# Patient Record
Sex: Female | Born: 2005 | Hispanic: Yes | Marital: Single | Smoking: Current every day smoker
Health system: Southern US, Community
[De-identification: ages and names within clinical notes are randomized; demographics above are authoritative.]

## PROBLEM LIST (undated history)

## (undated) DIAGNOSIS — S82202A Unspecified fracture of shaft of left tibia, initial encounter for closed fracture: Secondary | ICD-10-CM

## (undated) DIAGNOSIS — D229 Melanocytic nevi, unspecified: Secondary | ICD-10-CM

## (undated) DIAGNOSIS — IMO0002 Reserved for concepts with insufficient information to code with codable children: Secondary | ICD-10-CM

## (undated) DIAGNOSIS — R7871 Abnormal lead level in blood: Secondary | ICD-10-CM

## (undated) DIAGNOSIS — J45909 Unspecified asthma, uncomplicated: Secondary | ICD-10-CM

## (undated) DIAGNOSIS — E785 Hyperlipidemia, unspecified: Secondary | ICD-10-CM

## (undated) DIAGNOSIS — S82409A Unspecified fracture of shaft of unspecified fibula, initial encounter for closed fracture: Secondary | ICD-10-CM

## (undated) DIAGNOSIS — E663 Overweight: Secondary | ICD-10-CM

## (undated) HISTORY — DX: Hyperlipidemia, unspecified: E78.5

## (undated) HISTORY — DX: Unspecified asthma, uncomplicated: J45.909

## (undated) HISTORY — DX: Unspecified fracture of shaft of unspecified fibula, initial encounter for closed fracture: S82.409A

## (undated) HISTORY — DX: Overweight: E66.3

## (undated) HISTORY — DX: Abnormal lead level in blood: R78.71

## (undated) HISTORY — DX: Unspecified fracture of shaft of left tibia, initial encounter for closed fracture: S82.202A

## (undated) HISTORY — DX: Reserved for concepts with insufficient information to code with codable children: IMO0002

## (undated) HISTORY — DX: Melanocytic nevi, unspecified: D22.9

## (undated) HISTORY — PX: COSMETIC SURGERY: SHX468

---
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
## 2023-02-16 ENCOUNTER — Ambulatory Visit (HOSPITAL_COMMUNITY)
Admission: EM | Admit: 2023-02-16 | Discharge: 2023-02-16 | Disposition: A | Discharge status: Home / Self Care | Payer: Medicaid Other | Attending: Psychiatry | Admitting: Psychiatry

## 2023-02-16 DIAGNOSIS — R4589 Other symptoms and signs involving emotional state: Secondary | ICD-10-CM | POA: Insufficient documentation

## 2023-02-16 DIAGNOSIS — F4323 Adjustment disorder with mixed anxiety and depressed mood: Secondary | ICD-10-CM | POA: Insufficient documentation

## 2023-02-16 DIAGNOSIS — R443 Hallucinations, unspecified: Secondary | ICD-10-CM | POA: Insufficient documentation

## 2023-02-16 NOTE — ED Notes (Signed)
Pt discharged with  AVS.  AVS reviewed prior to discharge.  Pt alert, oriented, and ambulatory.  Safety maintained.  °

## 2023-02-16 NOTE — Progress Notes (Signed)
   02/16/23 1406  [facility] Triage Screening (Walk-ins at [facility] only)  How Did You Hear About [place]? Self  What Is the Reason for Your Visit/Call Today? Patient is a 17 year old female who presents voluntarily to [facility], due to patient overthinking a lot to the point that she says it is beginning to affect her in her everyday life. patient admits to having passive SI without intent or plan and says that she thinks about how it would impact others in her life. Patient denies HI or AVH. Patient denies having a mental health diagnosis. She acknowledges she smokes about a gram of marijuana daily along with vaping. Her boyfriend just broke up with her and she also reports having a lot of conflict with her parents, her dad especially. Patient was alert & oriented.  She was tearful but cooperative and answered questions appropriately.  How Long Has This Been Causing You Problems? <Week  Have You Recently Had Any Thoughts About Hurting Yourself? Yes  How long ago did you have thoughts about hurting yourself? today  Are You Planning to Commit Suicide/Harm Yourself At This time? No  Are You Planning To Harm Someone At This Time? No  Are you currently experiencing any auditory, visual or other hallucinations? No  Have You Used Any Alcohol or Drugs in the Past 24 Hours? Yes  How long ago did you use Drugs or Alcohol? yesterday  What Did You Use and How Much? marijuna 1 1/2 gram  Do you have any current medical co-morbidities that require immediate attention? No  Clinician description of patient physical appearance/behavior: p. is causually dressed, aler & oriented x4 tearful but copperative and answered questions appropriately  What Do You Feel Would Help You the Most Today? Treatment for Depression or other mood problem  If access to [facility] was not available, would you have sought care in the Emergency Department? No  Determination of Need Routine (7 days)  Options For Referral Outpatient Therapy

## 2023-02-16 NOTE — Discharge Instructions (Addendum)
Based on what you have shared, a list of resources for outpatient therapy and psychiatry is provided below to get you started back on treatment.  It is imperative that you follow through with treatment within 5-7 days from the day of discharge to prevent any further risk to your safety or mental well-being.  You are not limited to the list provided.  In case of an urgent crisis, you may contact the Mobile Crisis Unit with [facility] at [phone].        Outpatient Services for Therapy and Medication Management for [facility] [address] [place], [place], [address] [phone] phone  New Patient Assessment/Therapy [facility] Monday Wednesday Thursday: 8am until slots are full.- arrive by 7:30 on the second floor.  New Patient Psychiatry/Medication Management Walk-ins Monday-Friday: 8am-11am  For all walk-ins, we ask that you arrive by 7:30am because patient will be seen in the order of arrival.  Availability is limited; therefore, you may not be seen on the same day that you walk-in.  Our goal is to serve and meet the needs of our community to the best of our ability.   [facility] [address] [place], [place], [address] [phone] phone  [facility] [address] [place], [place], [address] [phone] phone [phone] phone ([address], Anthem/Elevance, [address], [address], [address], [address], Healthy [place], [place], [place], [address], [employer], [place], UHC, [employer], [place], Out of Network)  [facility], [place] [address] [place], [place], [address] [phone] phone ([place], Anthem/Elevance, [employer] Options/Carelon, BCBS, [address], [place], [place], [place], [place], [employer], [place], [place], [place], [facility])  [facility] [address] [place], [place],  [address] [phone] phone (Medicaid, ask about other insurance)  [facility] [address] [place], [place], [address] [phone] phone [phone] fax ([address], [place] , [place], [place], [place] Health Choice, UHC, General Electric, Self-Pay)  [facility] [address] [place], [place], [address] [phone] phone ([address], Anthem/Elevance, [address], [address], [place], [employer], [place], [place], [place], [employer], [place], [place], [place], [facility])  [facility] - 6-8 MONTH WAIT FOR THERAPY; [facility]NAG[facility] [address] [place], [place], [address] [phone] phone ([address] AmeriHealth [address] - [place], [address], [place], [place], Friday Health Plans, [address], BCBS Healthy [place], [place], [address], [place], [place], [place], [place], Tricare, UHC, [employer] Corporation, [place])  [facility] [address] [place], [place], [address] [phone] phone  [facility] [address] [place], [place], [address] [phone] phone  [facility] [address] [place], [place], [address] [phone] phone  [facility] [place] - THERAPY ONLY [address], [place], [address] [phone] phone  [facility], [place] [address], [place], [address] [phone] phone  [facility] [address] [place], [place], [address] [phone] phone [phone] fax  [facility] [address] [place], [place], [address] [phone] phone [phone] fax  [facility] [phone] [address] [place], [place], [address] [phone] phone  Jovita Kussmaul [address] [place], [place], [address]  [phone] phone  [facility]  (Adults Only) [address] [place], [place], [address]  [phone] phone [phone] fax

## 2023-02-16 NOTE — ED Provider Notes (Signed)
Behavioral Health Urgent Care Medical Screening Exam  Patient Name: Tabitha [name] MRN: [id number] Date of Evaluation: 02/16/23 Chief Complaint:  increased depression and anxiety due to current relationship with boyfriend.  Diagnosis:  Final diagnoses:  Adjustment disorder with mixed anxiety and depressed mood    History of Present illness: Tabitha [name] is a 17 y.o. female patient presented to [facility] as a walk in unaccompanied by with complaints of increased depression and anxiety due to current relationship with boyfriend.   Tabitha [name], 44 y.o., female patient seen face to face by this provider and chart reviewed on 02/16/23.  Patient denies any previous psychiatric history.  She has no psychiatric services in place.  She currently does not take any medications.  She denies any medical concerns.  She admits to smoking marijuana daily.  She does believe that marijuana makes her anxiety worse at times.  On evaluation Tabitha [name] reports she has been in a 2-year long relationship with her boyfriend.  A few months ago she was having some disagreements with her mother and father and she moved out went to live with her boyfriend.  She believes she has been overthinking different situations that have gone on in the relationship.  States he does what ever he wants to but holds her to different standards.  He broke up with her about 2 weeks ago.  She  is now living with her parents again.  States it has been very distressing because her boyfriend has blocked her on all social media and is "ghosting" her.  This is caused an increase in her depression and anxiety.  She feels helpless, decreased motivation, decreased focus, and feelings of loneliness.  She have a depressed affect and is tearful at times throughout the assessment.  She reports difficulty staying asleep at night.  She denies any concerns with appetite or sleep.  She adamantly denies suicidal  ideations.  She verbally contracts for safety.  She does not have access to firearms/weapons.  She denies any homicidal ideations.  She denies AVH.  She does not appear psychotic, paranoid or manic. Objectively, there is no evidence of psychosis/mania or delusional thinking.  She conversed coherently, with goal directed thoughts, and no distractibility, or pre-occupation.  Educated patient on the adverse effects of marijuana use.  She verbalized understanding.  Discussed partial hospitalization program (PHP) with [facility].  Patient reports she did start a new job at [employer] and she is unsure if she would be able to work around her schedule.  She is interested in individual therapy as well.  Provided resources for medication management and therapy.  Provided open access walk-in hours to [facility] on the second floor.  At this time Tabitha [name] is educated and verbalizes understanding of mental health resources and other crisis services in the community. She is instructed to call 911 and present to the nearest emergency room should sh experience any suicidal/homicidal ideation, auditory/visual/hallucinations, or detrimental worsening of her mental health condition.  She was a also advised by Clinical research associate that She could call the toll-free phone on back of  insurance card to assist with identifying counselors and agencies in network.  Medicaid card to speak with care coordinator.    Flowsheet Row ED from 02/16/2023 in [facility] ED to Hosp-Admission (Discharged) from 02/19/2022 in [place] [facility]TH  SURGICAL ED from 04/10/2021 in [facility] Emergency Department at [facility]  C-SSRS RISK CATEGORY Low Risk  No Risk No Risk       Psychiatric Specialty Exam  Presentation  General Appearance:Appropriate for Environment  Eye Contact:Good  Speech:Clear and Coherent; Normal Rate  Speech Volume:Normal  Handedness:Right   Mood and Affect   Mood:Anxious; Depressed  Affect:Congruent   Thought Process  Thought Processes:Coherent  Descriptions of Associations:Intact  Orientation:Full (Time, Place and Person)  Thought Content:Logical    Hallucinations:None  Ideas of Reference:None  Suicidal Thoughts:No  Homicidal Thoughts:No   Sensorium  Memory:Immediate Good; Recent Good; Remote Good  Judgment:Good  Insight:Good   Executive Functions  Concentration:Good  Attention Span:Good  Recall:Good  Fund of Knowledge:Good  Language:Good   Psychomotor Activity  Psychomotor Activity:Normal   Assets  Assets:Communication Skills; Desire for Improvement; Financial Resources/Insurance; Housing; Physical Health; Resilience; Social Support   Sleep  Sleep:Fair  Number of hours: No data recorded  Physical Exam: Physical Exam Vitals and nursing note reviewed.  Constitutional:      General: She is not in acute distress.    Appearance: Normal appearance. She is not ill-appearing.  HENT:     Head: Normocephalic.  Eyes:     General:        Right eye: No discharge.        Left eye: No discharge.  Cardiovascular:     Rate and Rhythm: Normal rate.  Pulmonary:     Effort: Pulmonary effort is normal.  Musculoskeletal:        General: Normal range of motion.     Cervical back: Normal range of motion.  Skin:    Coloration: Skin is not jaundiced or pale.  Neurological:     Mental Status: She is alert and oriented to person, place, and time.  Psychiatric:        Attention and Perception: Attention and perception normal.        Mood and Affect: Mood is anxious and depressed. Affect is tearful.        Speech: Speech normal.        Behavior: Behavior normal. Behavior is cooperative.        Thought Content: Thought content normal.        Cognition and Memory: Cognition normal.        Judgment: Judgment normal.    Review of Systems  Constitutional: Negative.   HENT: Negative.    Eyes: Negative.    Respiratory: Negative.    Cardiovascular: Negative.   Musculoskeletal: Negative.   Skin: Negative.   Neurological: Negative.   Psychiatric/Behavioral:  Positive for depression. The patient is nervous/anxious.    Blood pressure 136/82, pulse 79, temperature 98.5 F (36.9 C), temperature source Oral, resp. rate 18, SpO2 98%. There is no height or weight on file to calculate BMI.  Musculoskeletal: Strength & Muscle Tone: within normal limits Gait & Station: normal Patient leans: N/A   [facility] MSE Discharge Disposition for Follow up and Recommendations: Based on my evaluation the patient does not appear to have an emergency medical condition and can be discharged with resources and follow up care in outpatient services for Medication Management, Partial Hospitalization Program, and Individual Therapy  Discharge patient  Provided outpatient psychiatric resources for medication management and therapy.  Discussed referral to [facility] program with [facility]    Ardis Hughs, NP 02/16/2023, 4:16 PM

## 2023-02-17 ENCOUNTER — Other Ambulatory Visit: Payer: Self-pay

## 2023-02-17 ENCOUNTER — Encounter: Payer: Self-pay | Admitting: Obstetrics and Gynecology

## 2023-02-17 ENCOUNTER — Ambulatory Visit (INDEPENDENT_AMBULATORY_CARE_PROVIDER_SITE_OTHER): Payer: Medicaid Other | Admitting: Obstetrics and Gynecology

## 2023-02-17 VITALS — BP 118/70 | HR 81 | Ht 63.0 in | Wt 146.0 lb

## 2023-02-17 DIAGNOSIS — N914 Secondary oligomenorrhea: Secondary | ICD-10-CM

## 2023-02-17 DIAGNOSIS — E282 Polycystic ovarian syndrome: Secondary | ICD-10-CM

## 2023-02-17 MED ORDER — MEDROXYPROGESTERONE ACETATE 10 MG PO TABS: 10.0000 mg | ORAL_TABLET | Freq: Every day | ORAL | 3 refills | Status: AC

## 2023-02-17 MED ORDER — MEDROXYPROGESTERONE ACETATE 10 MG PO TABS: 10.0000 mg | ORAL_TABLET | Freq: Every day | ORAL | 3 refills | Status: DC

## 2023-02-17 NOTE — Progress Notes (Signed)
GYNECOLOGY OFFICE VISIT NOTE  History:   Tabitha [name] is a 17 y.o. G0P0000 here today for discussion regarding PCOS.   Diagnosed with PCOS by pediatrician many years ago. But she questions this diagnosis. Diagnosis was made by history. Menarche was 12-13 years. Diagnosis was made shortly after menarche.   Sometimes monthly period, sometimes every 3-6 months. Sometimes they are heavy.  LMP 01/30/23 - was 5 day typical period.   GAD7/PHQ9 elevated - [facility]. Planning to go on Thursday for walk in therapist.   Micah Flesher on birth control when menses started and feels like mood isn't the same since.   Uses withdrawal method for birth control.  Sometimes restrictive eating.     Past Medical History:  Diagnosis Date   Asthma    Phreesia 01/08/2020   Behavior problems 3/12   Elevated blood lead level 2/07 - 8/07   Fracture closed, fibula, shaft 1/08   fell from trampoline   Fracture of tibia, left, closed 1/08   fell from trampoline   Hyperlipidemia 3/12   elevated cholesterol, triglyceride and VLDL   Nevus congenital giant   One very large, covering left shoulder, upper chest;  excised 9/05.  Multiple other smaller nevi.    Overweight 2/11    Past Surgical History:  Procedure Laterality Date   CHOLECYSTECTOMY N/A 02/20/2022   Procedure: LAPAROSCOPIC CHOLECYSTECTOMY;  Surgeon: Kinsinger, De Blanch, MD;  Location: [facility];  Service: General;  Laterality: N/A;   COSMETIC SURGERY      The following portions of the patient's history were reviewed and updated as appropriate: allergies, current medications, past family history, past medical history, past social history, past surgical history and problem list.   Review of Systems:  Pertinent items noted in HPI and remainder of comprehensive ROS otherwise negative.  Physical Exam:  BP 118/70   Pulse 81   Ht 5\' 3"  (1.6 m)   Wt 146 lb (66.2 kg)   LMP 01/30/2023 (Within Days)   BMI 25.86  kg/m  CONSTITUTIONAL: Well-developed, well-nourished female in no acute distress.  HEENT:  Normocephalic, atraumatic. External right and left ear normal. No scleral icterus.  NECK: Normal range of motion, supple, no masses noted on observation SKIN: No rash noted. Not diaphoretic. No erythema. No pallor. MUSCULOSKELETAL: Normal range of motion. No edema noted. NEUROLOGIC: Alert and oriented to person, place, and time. Normal muscle tone coordination. No cranial nerve deficit noted. PSYCHIATRIC: Normal mood and affect. Normal behavior. Normal judgment and thought content. PELVIC: Deferred  Labs and Imaging No results found for this or any previous visit (from the past 168 hour(s)). No results found.  Assessment and Plan:   1. Secondary oligomenorrhea Will reconfirm PCOS diagnosis per pt request.   2. PCOS (polycystic ovarian syndrome) - Discussed diagnosis of PCOS and on what basis. Reviewed long term implication of issues with weight loss, metabolic syndrome, infertility - Labwork for FLP and HgBA1C: Due - new Rx given - Reviewed possible help of Myoinositiol/D-chiroinositol with weight loss. Offered referral to dietician. She  Declines - she did it in past .  - Discussed importance of endometrial protection with different hormonal methods to prevent hyperplasia/malignancy in the setting of oligomenorrhea.  - She would like to try:  Provera       -     17-Hydroxyprogesterone -     Estradiol -     Follicle stimulating hormone -     Luteinizing hormone -  Progesterone -     Prolactin -     Testosterone , Free and Total -     TSH -     Anti mullerian hormone -     HgB A1c -     Lipid Profile -     Discontinue: medroxyPROGESTERone (PROVERA) 10 MG tablet; Take 1 tablet (10 mg total) by mouth daily. Use for ten days -     medroxyPROGESTERone (PROVERA) 10 MG tablet; Take 1 tablet (10 mg total) by mouth daily. Use for ten days    Meds ordered this encounter  Medications    DISCONTD: medroxyPROGESTERone (PROVERA) 10 MG tablet    Sig: Take 1 tablet (10 mg total) by mouth daily. Use for ten days    Dispense:  10 tablet    Refill:  3   DISCONTD: medroxyPROGESTERone (PROVERA) 10 MG tablet    Sig: Take 1 tablet (10 mg total) by mouth daily. Use for ten days    Dispense:  10 tablet    Refill:  3   medroxyPROGESTERone (PROVERA) 10 MG tablet    Sig: Take 1 tablet (10 mg total) by mouth daily. Use for ten days    Dispense:  10 tablet    Refill:  3     Routine preventative health maintenance measures emphasized. Please refer to After Visit Summary for other counseling recommendations.   Return for 2-4 week follow up to discuss labs.  Milas Hock, MD, FACOG Obstetrician & Gynecologist, [facility] for [facility], [facility]

## 2023-02-17 NOTE — Progress Notes (Signed)
Patient has elevated phq 9 & gad 7- offered [facility] services. Patient currently plans follow up at [facility]

## 2023-02-17 NOTE — Addendum Note (Signed)
Addended by: Milas Hock A on: 02/17/2023 05:04 PM   Modules accepted: Orders

## 2023-02-19 LAB — TESTOSTERONE,FREE AND TOTAL
Testosterone, Free: 4.4 pg/mL
Testosterone: 48 ng/dL (ref 13–71)

## 2023-03-12 ENCOUNTER — Ambulatory Visit (HOSPITAL_COMMUNITY)
Admission: EM | Admit: 2023-03-12 | Discharge: 2023-03-12 | Disposition: A | Discharge status: Home / Self Care | Payer: Medicaid Other | Attending: Physician Assistant | Admitting: Physician Assistant

## 2023-03-12 ENCOUNTER — Encounter (HOSPITAL_COMMUNITY): Payer: Self-pay

## 2023-03-12 DIAGNOSIS — U071 COVID-19: Secondary | ICD-10-CM | POA: Insufficient documentation

## 2023-03-12 DIAGNOSIS — J069 Acute upper respiratory infection, unspecified: Secondary | ICD-10-CM | POA: Diagnosis present

## 2023-03-12 LAB — SARS CORONAVIRUS 2 (TAT 6-24 HRS): SARS Coronavirus 2: POSITIVE — AB

## 2023-03-12 NOTE — ED Triage Notes (Signed)
Patient c/o an intermittent productive cough with white sputum, fever, nasal congestion, fatigue, and a headache x 3 days.  Patient states she has been taking Mucinex  and XL-3 (hispanic medication for cold and cough)

## 2023-03-12 NOTE — ED Provider Notes (Signed)
[facility]    CSN: [id number] Arrival date & time: 03/12/23  1020      History   Chief Complaint Chief Complaint  Patient presents with   Fever   Cough   Nasal Congestion   Fatigue   Headache    HPI Tabitha [name] is a 17 y.o. female.   Patient here today for evaluation of cough, fever, nasal congestion, fatigue and headache that she has had for 3 days.  She states that cough is productive at times.  She has been taking Mucinex and an Hispanic medication for cold and cough without resolution of symptoms.  She denies any vomiting or diarrhea.  The history is provided by the patient.  Fever Associated symptoms: congestion, cough, headaches and sore throat   Associated symptoms: no diarrhea, no ear pain, no nausea and no vomiting   Cough Associated symptoms: fever, headaches and sore throat   Associated symptoms: no ear pain, no eye discharge, no shortness of breath and no wheezing   Headache Associated symptoms: congestion, cough, fatigue, fever and sore throat   Associated symptoms: no abdominal pain, no diarrhea, no ear pain, no nausea and no vomiting     Past Medical History:  Diagnosis Date   Asthma    Phreesia 01/08/2020   Behavior problems 3/12   Elevated blood lead level 2/07 - 8/07   Fracture closed, fibula, shaft 1/08   fell from trampoline   Fracture of tibia, left, closed 1/08   fell from trampoline   Hyperlipidemia 3/12   elevated cholesterol, triglyceride and VLDL   Nevus congenital giant   One very large, covering left shoulder, upper chest;  excised 9/05.  Multiple other smaller nevi.    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   PCOS (polycystic ovarian syndrome) 07/15/2016   Hyperlipidemia 06/21/2013   Scar condition and fibrosis of skin 01/20/2011   Benign neoplasm of skin of trunk 10/01/2010    Past Surgical History:   Procedure Laterality Date   CHOLECYSTECTOMY N/A 02/20/2022   Procedure: LAPAROSCOPIC CHOLECYSTECTOMY;  Surgeon: Rodman Pickle, MD;  Location: [facility];  Service: General;  Laterality: N/A;   COSMETIC SURGERY      OB History     Gravida  0   Para  0   Term  0   Preterm  0   AB  0   Living  0      SAB  0   IAB  0   Ectopic  0   Multiple  0   Live Births  0            Home Medications    Prior to Admission medications   Medication Sig Start Date End Date Taking? Authorizing Provider  acetaminophen (TYLENOL) 500 MG tablet Take 2 tablets (1,000 mg total) by mouth every 6 (six) hours as needed for mild pain. 02/21/22   Meuth, Brooke A, PA-C  albuterol (VENTOLIN HFA) 108 (90 Base) MCG/ACT inhaler Use 2 puffs 15 minutes before exercise.  Always use spacer. Patient taking differently: Inhale 2 puffs into the lungs every 6 (six) hours as needed for shortness of breath. 07/17/20   Darrall Dears, MD  medroxyPROGESTERone (PROVERA) 10 MG tablet Take 1 tablet (10 mg total) by mouth daily. Use for ten days 02/17/23   Milas Hock, MD    Family History Family History  Problem Relation Age of Onset   Hypertension Maternal Grandmother        no official diagnosis   Asthma Maternal Grandfather    Hypertension Paternal Grandmother    Cancer Paternal Grandfather        throat cancer, smoker and drinks alcohol    Social History Social History   Tobacco Use   Smoking status: Every Day    Types: E-cigarettes    Passive exposure: Never   Smokeless tobacco: Never  Vaping Use   Vaping status: Some Days   Substances: Nicotine  Substance Use Topics   Alcohol use: Yes   Drug use: Yes    Types: Marijuana     Allergies   Bacitracin   Review of Systems Review of Systems  Constitutional:  Positive for fatigue and fever.  HENT:  Positive for congestion and sore throat. Negative for ear pain.   Eyes:  Negative for discharge and redness.  Respiratory:   Positive for cough. Negative for shortness of breath and wheezing.   Gastrointestinal:  Negative for abdominal pain, diarrhea, nausea and vomiting.  Neurological:  Positive for headaches.     Physical Exam Triage Vital Signs ED Triage Vitals  Encounter Vitals Group     BP      Systolic BP Percentile      Diastolic BP Percentile      Pulse      Resp      Temp      Temp src      SpO2      Weight      Height      Head Circumference      Peak Flow      Pain Score      Pain Loc      Pain Education      Exclude from Growth Chart    No data found.  Updated Vital Signs BP 112/78 (BP Location: Right Arm)   Pulse 83   Temp 98.2 F (36.8 C) (Oral)   Resp 16   LMP 01/24/2023 (Within Days)   SpO2 98%   Physical Exam Vitals and nursing note reviewed.  Constitutional:      General: She is not in acute distress.    Appearance: Normal appearance. She is not ill-appearing.  HENT:     Head: Normocephalic and atraumatic.     Nose: Congestion present.     Mouth/Throat:     Mouth: Mucous membranes are moist.     Pharynx: No oropharyngeal exudate or posterior oropharyngeal erythema.  Eyes:     Conjunctiva/sclera: Conjunctivae normal.  Cardiovascular:     Rate and Rhythm: Normal rate and regular rhythm.     Heart sounds: Normal heart sounds. No murmur heard. Pulmonary:     Effort: Pulmonary effort is normal. No respiratory distress.     Breath sounds: Normal breath sounds. No wheezing, rhonchi or rales.  Skin:    General: Skin is warm and dry.  Neurological:     Mental Status: She is alert.  Psychiatric:        Mood and Affect: Mood normal.        Thought Content: Thought content normal.      UC Treatments / Results  Labs (all labs ordered are listed, but only abnormal results are displayed) Labs Reviewed  SARS CORONAVIRUS 2 (TAT 6-24 HRS)    EKG   Radiology No results found.  Procedures Procedures (including critical care time)  Medications Ordered in  UC Medications -  No data to display  Initial Impression / Assessment and Plan / UC Course  I have reviewed the triage vital signs and the nursing notes.  Pertinent labs & imaging results that were available during my care of the patient were reviewed by me and considered in my medical decision making (see chart for details).    Suspect viral etiology of symptoms.  Will screen for COVID.  Recommended symptomatic treatment, increase fluids and rest.  Will await results for further recommendation.  Final Clinical Impressions(s) / UC Diagnoses   Final diagnoses:  Viral upper respiratory tract infection   Discharge Instructions   None    ED Prescriptions   None    PDMP not reviewed this encounter.   Tomi Bamberger, PA-C 03/12/23 1308

## 2023-03-20 ENCOUNTER — Ambulatory Visit: Payer: Medicaid Other | Admitting: Obstetrics and Gynecology

## 2023-05-27 ENCOUNTER — Other Ambulatory Visit: Payer: Self-pay

## 2023-05-27 ENCOUNTER — Ambulatory Visit (INDEPENDENT_AMBULATORY_CARE_PROVIDER_SITE_OTHER): Payer: Medicaid Other | Admitting: Obstetrics and Gynecology

## 2023-05-27 ENCOUNTER — Encounter: Payer: Self-pay | Admitting: Obstetrics and Gynecology

## 2023-05-27 VITALS — BP 119/79 | HR 121

## 2023-05-27 DIAGNOSIS — Z1330 Encounter for screening examination for mental health and behavioral disorders, unspecified: Secondary | ICD-10-CM | POA: Diagnosis not present

## 2023-05-27 DIAGNOSIS — E282 Polycystic ovarian syndrome: Secondary | ICD-10-CM

## 2023-05-27 NOTE — Patient Instructions (Signed)
The medications used for ovulation induction are clomid and letrozole. These are taken to help you ovulate after you've had a period to help you get pregnant.   Other medications that are used to help regulate the cycle are either some form of progesterone or estrogen.   There are times we may use metformin or inositol to help with ovulation .

## 2023-05-28 NOTE — Progress Notes (Signed)
    GYNECOLOGY VISIT  Patient name: Tabitha [name] MRN [id number]  Date of birth: [birth year] Chief Complaint:   discuss labs and Gynecologic Exam   History:  Tabitha [name] is a 17 y.o. G0P0000 being seen today for follow up of labs. Previously diagnosed with PCOS. Locked out of mychart so did not see results. Has irregular menses. Not actively trying to conceive but would welcome a pregnancy at this time.     Past Medical History:  Diagnosis Date   Asthma    Phreesia 01/08/2020   Behavior problems 3/12   Elevated blood lead level 2/07 - 8/07   Fracture closed, fibula, shaft 1/08   fell from trampoline   Fracture of tibia, left, closed 1/08   fell from trampoline   Hyperlipidemia 3/12   elevated cholesterol, triglyceride and VLDL   Nevus congenital giant   One very large, covering left shoulder, upper chest;  excised 9/05.  Multiple other smaller nevi.    Overweight 2/11    Past Surgical History:  Procedure Laterality Date   CHOLECYSTECTOMY N/A 02/20/2022   Procedure: LAPAROSCOPIC CHOLECYSTECTOMY;  Surgeon: Kinsinger, De Blanch, MD;  Location: [facility];  Service: General;  Laterality: N/A;   COSMETIC SURGERY      The following portions of the patient's history were reviewed and updated as appropriate: allergies, current medications, past family history, past medical history, past social history, past surgical history and problem list.   Health Maintenance:   Last pap n/a Last mammogram: n/a   Review of Systems:  Pertinent items are noted in HPI. Comprehensive review of systems was otherwise negative.   Objective:  Physical Exam BP 119/79   Pulse (!) 121    Physical Exam Vitals and nursing note reviewed.  Constitutional:      Appearance: Normal appearance.  HENT:     Head: Normocephalic and atraumatic.  Pulmonary:     Effort: Pulmonary effort is normal.  Skin:    General: Skin is warm and dry.  Neurological:     General: No focal deficit  present.     Mental Status: She is alert.  Psychiatric:        Mood and Affect: Mood normal.        Behavior: Behavior normal.        Thought Content: Thought content normal.        Judgment: Judgment normal.         Assessment & Plan:   1. PCOS (polycystic ovarian syndrome) Labs and history consistent with PCOS. Noted that interventions   Counseled patient about management of PCOS, recommended weight loss which helps with restoring ovulatory cycles, decreases glucose intolerance with improvement of metabolic risk, improves fertility/pregnancy rates and helps with overall health.  Even modest weight loss (5 to 10 percent reduction in body weight) in women with PCOS may result in these effects.  OCPs are also the mainstay of pharmacologic therapy for women with PCOS for managing hyperandrogenism and menstrual dysfunction and for providing contraception.  Given that she is not trying to prevent pregnancy, discussed cyclic provera for endometrial protection and welcomes spontaneous conception if it occurs. If unable to get pregnant once actively attempting to conceive, can trial ovulation induction.    Routine preventative health maintenance measures emphasized.  Lorriane Shire, MD [facility] for [facility], [facility]

## 2023-07-17 ENCOUNTER — Ambulatory Visit
Admission: EM | Admit: 2023-07-17 | Discharge: 2023-07-17 | Disposition: A | Discharge status: Home / Self Care | Payer: Medicaid Other | Attending: Emergency Medicine | Admitting: Emergency Medicine

## 2023-07-17 ENCOUNTER — Encounter: Payer: Self-pay | Admitting: Emergency Medicine

## 2023-07-17 DIAGNOSIS — N390 Urinary tract infection, site not specified: Secondary | ICD-10-CM | POA: Diagnosis present

## 2023-07-17 DIAGNOSIS — R0981 Nasal congestion: Secondary | ICD-10-CM | POA: Diagnosis present

## 2023-07-17 LAB — URINALYSIS, W/ REFLEX TO CULTURE (INFECTION SUSPECTED)
Bilirubin Urine: NEGATIVE
Glucose, UA: NEGATIVE mg/dL
Ketones, ur: NEGATIVE mg/dL
Nitrite: NEGATIVE
Protein, ur: NEGATIVE mg/dL
Specific Gravity, Urine: 1.01 (ref 1.005–1.030)
pH: 6.5 (ref 5.0–8.0)

## 2023-07-17 LAB — WET PREP, GENITAL
Clue Cells Wet Prep HPF POC: NONE SEEN
Trich, Wet Prep: NONE SEEN
WBC, Wet Prep HPF POC: >10 — AB (ref <10)
Yeast Wet Prep HPF POC: NONE SEEN

## 2023-07-17 LAB — PREGNANCY, URINE: Preg Test, Ur: NEGATIVE

## 2023-07-17 MED ORDER — IPRATROPIUM BROMIDE 0.06 % NA SOLN: 2.0000 | Freq: Four times a day (QID) | NASAL | 12 refills | Status: AC

## 2023-07-17 MED ORDER — NITROFURANTOIN MONOHYD MACRO 100 MG PO CAPS: 100.0000 mg | ORAL_CAPSULE | Freq: Two times a day (BID) | ORAL | 0 refills | Status: AC

## 2023-07-17 NOTE — ED Triage Notes (Signed)
Pt c/o nasal congestion for several weeks. Pt states she wakes up and can't breath out her her nose.  She also has some abdominal cramping and some spotting last month. She has PCOS and reports no period for the past 4 month. She also has some vaginal discharge.

## 2023-07-17 NOTE — Discharge Instructions (Addendum)
Your urine pregnancy test was negative and your vaginal swab did not show any evidence of bacterial vaginosis, trichomonas, or yeast infection.  Your urinalysis did show signs of infection as it has a lot of bacteria and white blood cells present.  Also some blood.  Take the Macrobid 100 mg twice daily with food for 5 days for treatment of urinary tract infection.  Increase your oral fluid intake so that you increase urine production to help flush your urinary tract.  We will send your urine for culture and if we need to make an adjustment to the antibiotics based on the culture results we will contact you by phone.  Otherwise your culture results will show up in your MyChart.  With regards to your nasal congestion, I suspect that this is coming from environmental exposure at your job.  Start taking over-the-counter Claritin, Zyrtec, or Allegra once daily and see if this helps with the nasal congestion.  Also, use the Atrovent nasal spray, 2 squirts of each nostril every 6 hours, as needed for nasal congestion.  If these measures do not help her symptoms and you may want to consider wearing a mask at work to see if filtering out the dust improves your nasal symptoms.  If you develop any new or worsening symptoms please return for reevaluation or see your primary care provider.

## 2023-07-17 NOTE — ED Provider Notes (Signed)
[facility]    CSN: [id number] Arrival date & time: 07/17/23  1718      History   Chief Complaint Chief Complaint  Patient presents with   Nasal Congestion   Abdominal Pain    HPI Tabitha [name] is a 17 y.o. female.   HPI  17 year old female with a past medical history significant for hyperlipidemia, exercise-induced asthma, and PCOS presents for evaluation of respiratory and genitourinary complaints.  Her respiratory complaint has been going on for last several weeks and involves nasal congestion that primarily in place when she wakes up in the morning.  She states that she has been working at [employer] and works in a dusty environment and some of her mucus has been black when she has blown her nose.  She denies any fever, sore throat, or cough. Her second complaint is that she has been experiencing some suprapubic abdominal cramping and spotting over the last month.  She has a history of PCOS and has not had a menstrual cycle in the last 4 months.  It is not uncommon for her to go months without having a menstrual cycle.  She was at 1 time on birth control for regulation of her cycle but she stopped taking it because she feared long-term side effects.  She reports that she is sexually active and she is not using protection.  She has a monogamous partner and denies any concern for STIs.  She denies any vaginal itching but does endorse vaginal discharge.  No urinary symptoms.  Past Medical History:  Diagnosis Date   Asthma    [employer] 01/08/2020   Behavior problems 3/12   Elevated blood lead level 2/07 - 8/07   Fracture closed, fibula, shaft 1/08   fell from trampoline   Fracture of tibia, left, closed 1/08   fell from trampoline   Hyperlipidemia 3/12   elevated cholesterol, triglyceride and VLDL   Nevus congenital giant   One very large, covering left shoulder, upper chest;  excised 9/05.  Multiple other smaller nevi.    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   PCOS (polycystic ovarian syndrome) 07/15/2016   Hyperlipidemia 06/21/2013   Scar condition and fibrosis of skin 01/20/2011   Benign neoplasm of skin of trunk 10/01/2010    Past Surgical History:  Procedure Laterality Date   CHOLECYSTECTOMY N/A 02/20/2022   Procedure: LAPAROSCOPIC CHOLECYSTECTOMY;  Surgeon: Rodman Pickle, MD;  Location: [facility] OR;  Service: General;  Laterality: N/A;   COSMETIC SURGERY      OB History     Gravida  0   Para  0   Term  0   Preterm  0   AB  0   Living  0      SAB  0   IAB  0   Ectopic  0   Multiple  0   Live Births  0            Home Medications    Prior to Admission medications   Medication Sig Start Date End Date Taking? Authorizing Provider  ipratropium (ATROVENT) 0.06 % nasal spray Place 2 sprays into both nostrils 4 (four) times daily. 07/17/23  Yes Becky Augusta, NP  nitrofurantoin, macrocrystal-monohydrate, (MACROBID) 100 MG capsule Take 1 capsule (100 mg total) by mouth 2 (two) times daily. 07/17/23  Yes Becky Augusta, NP  acetaminophen (TYLENOL) 500 MG  tablet Take 2 tablets (1,000 mg total) by mouth every 6 (six) hours as needed for mild pain. 02/21/22   Meuth, Brooke A, PA-C  albuterol (VENTOLIN HFA) 108 (90 Base) MCG/ACT inhaler Use 2 puffs 15 minutes before exercise.  Always use spacer. Patient taking differently: Inhale 2 puffs into the lungs every 6 (six) hours as needed for shortness of breath. 07/17/20   Darrall Dears, MD  medroxyPROGESTERone (PROVERA) 10 MG tablet Take 1 tablet (10 mg total) by mouth daily. Use for ten days Patient not taking: Reported on 05/27/2023 02/17/23   Milas Hock, MD    Family History Family History  Problem Relation Age of Onset   Hypertension Maternal Grandmother        no official diagnosis   Asthma Maternal Grandfather    Hypertension Paternal Grandmother     Cancer Paternal Grandfather        throat cancer, smoker and drinks alcohol    Social History Social History   Tobacco Use   Smoking status: Former    Types: Cigarettes    Passive exposure: Never   Smokeless tobacco: Never  Vaping Use   Vaping status: Some Days   Substances: Nicotine  Substance Use Topics   Alcohol use: Yes   Drug use: Not Currently    Types: Marijuana     Allergies   Bacitracin   Review of Systems Review of Systems  Constitutional:  Negative for fever.  HENT:  Positive for congestion. Negative for ear pain, rhinorrhea and sore throat.   Respiratory:  Negative for cough.   Gastrointestinal:  Positive for abdominal pain.  Genitourinary:  Positive for vaginal bleeding and vaginal discharge. Negative for dysuria, frequency, hematuria, urgency and vaginal pain.  Musculoskeletal:  Negative for back pain.     Physical Exam Triage Vital Signs ED Triage Vitals  Encounter Vitals Group     BP      Systolic BP Percentile      Diastolic BP Percentile      Pulse      Resp      Temp      Temp src      SpO2      Weight      Height      Head Circumference      Peak Flow      Pain Score      Pain Loc      Pain Education      Exclude from Growth Chart    No data found.  Updated Vital Signs BP 105/70 (BP Location: Right Arm)   Pulse 96   Temp 98.6 F (37 C) (Oral)   Resp 16   LMP  (LMP Unknown)   SpO2 98%   Visual Acuity Right Eye Distance:   Left Eye Distance:   Bilateral Distance:    Right Eye Near:   Left Eye Near:    Bilateral Near:     Physical Exam Vitals and nursing note reviewed.  Constitutional:      Appearance: Normal appearance. She is not ill-appearing.  HENT:     Head: Normocephalic and atraumatic.     Nose: Congestion present. No rhinorrhea.     Comments: Patient mucosa is mildly erythematous and edematous with scant clear discharge in both nares.    Mouth/Throat:     Mouth: Mucous membranes are moist.     Pharynx:  Oropharynx is clear. No oropharyngeal exudate or posterior oropharyngeal erythema.  Cardiovascular:     Rate  and Rhythm: Normal rate and regular rhythm.     Pulses: Normal pulses.     Heart sounds: Normal heart sounds. No murmur heard.    No friction rub. No gallop.  Pulmonary:     Effort: Pulmonary effort is normal.     Breath sounds: Normal breath sounds. No wheezing, rhonchi or rales.  Abdominal:     General: Abdomen is flat.     Palpations: Abdomen is soft.     Tenderness: There is abdominal tenderness. There is no right CVA tenderness, left CVA tenderness, guarding or rebound.     Comments: Mild suprapubic and left adnexal tenderness.  No guarding.  Musculoskeletal:     Cervical back: Normal range of motion and neck supple.  Lymphadenopathy:     Cervical: No cervical adenopathy.  Skin:    General: Skin is warm and dry.     Capillary Refill: Capillary refill takes less than 2 seconds.     Findings: No rash.  Neurological:     General: No focal deficit present.     Mental Status: She is alert and oriented to person, place, and time.      UC Treatments / Results  Labs (all labs ordered are listed, but only abnormal results are displayed) Labs Reviewed  WET PREP, GENITAL - Abnormal; Notable for the following components:      Result Value   WBC, Wet Prep HPF POC >10 (*)    All other components within normal limits  URINALYSIS, W/ REFLEX TO CULTURE (INFECTION SUSPECTED) - Abnormal; Notable for the following components:   APPearance HAZY (*)    Hgb urine dipstick TRACE (*)    Leukocytes,Ua LARGE (*)    Bacteria, UA FEW (*)    All other components within normal limits  URINE CULTURE  PREGNANCY, URINE    EKG   Radiology No results found.  Procedures Procedures (including critical care time)  Medications Ordered in UC Medications - No data to display  Initial Impression / Assessment and Plan / UC Course  I have reviewed the triage vital signs and the nursing  notes.  Pertinent labs & imaging results that were available during my care of the patient were reviewed by me and considered in my medical decision making (see chart for details).   Patient is a pleasant, nontoxic-appearing 17 year old female presenting for evaluation of respiratory and genitourinary complaints as outlined HPI above.  With regards to her respiratory complaints it predominantly consist of congestion when she wakes up in the mornings.  She works in a dusty environment at [employer] and reports that she began experiencing nasal congestion after she started work there.  She does not have any other URI symptoms.  He does have inflammation of her nasal mucosa with scant clear discharge.  I suspect that she is experiencing the nasal congestion as a result of this environmental exposure.  I did suggest that she use over-the-counter antihistamine such as Claritin, Zyrtec, or Allegra to help with her nasal congestion and I will also prescribe Atrovent nasal spray that she can use 4 times a day as needed for nasal congestion.  With regards to her vaginal discharge and abdominal cramping I will order vaginal wet prep to assess for possible BV or vaginal yeast infection.  She is not experiencing any UTI symptoms though I will order a urinalysis to evaluate for possible pregnancy given that she has not had a menstrual cycle in 4 months though she is sexually active.  Urine pregnancy test  is negative.  Vaginal wet prep is negative for yeast, trichomonas, or clue cells.  Urinalysis shows hazy appearance with trace hemoglobin and large leukocyte esterase.  Nitrites and protein are negative.  Reflex microscopy shows 21-50 WBCs with few bacteria, WBC clumps, and sperm present.  I will send urine for culture.  I will discharge patient home with a diagnosis of urinary tract infection and start her on Macrobid twice daily for 5 days.   Final Clinical Impressions(s) / UC Diagnoses   Final diagnoses:  Lower  urinary tract infectious disease  Nasal congestion     Discharge Instructions      Your urine pregnancy test was negative and your vaginal swab did not show any evidence of bacterial vaginosis, trichomonas, or yeast infection.  Your urinalysis did show signs of infection as it has a lot of bacteria and white blood cells present.  Also some blood.  Take the Macrobid 100 mg twice daily with food for 5 days for treatment of urinary tract infection.  Increase your oral fluid intake so that you increase urine production to help flush your urinary tract.  We will send your urine for culture and if we need to make an adjustment to the antibiotics based on the culture results we will contact you by phone.  Otherwise your culture results will show up in your MyChart.  With regards to your nasal congestion, I suspect that this is coming from environmental exposure at your job.  Start taking over-the-counter Claritin, Zyrtec, or Allegra once daily and see if this helps with the nasal congestion.  Also, use the Atrovent nasal spray, 2 squirts of each nostril every 6 hours, as needed for nasal congestion.  If these measures do not help her symptoms and you may want to consider wearing a mask at work to see if filtering out the dust improves your nasal symptoms.  If you develop any new or worsening symptoms please return for reevaluation or see your primary care provider.     ED Prescriptions     Medication Sig Dispense Auth. Provider   nitrofurantoin, macrocrystal-monohydrate, (MACROBID) 100 MG capsule Take 1 capsule (100 mg total) by mouth 2 (two) times daily. 10 capsule Becky Augusta, NP   ipratropium (ATROVENT) 0.06 % nasal spray Place 2 sprays into both nostrils 4 (four) times daily. 15 mL Becky Augusta, NP      PDMP not reviewed this encounter.   Becky Augusta, NP 07/17/23 1840

## 2023-07-18 LAB — URINE CULTURE: Culture: <10000 — AB

## 2024-02-15 ENCOUNTER — Encounter (HOSPITAL_COMMUNITY): Payer: Self-pay

## 2024-02-15 ENCOUNTER — Emergency Department (HOSPITAL_COMMUNITY)
Admission: EM | Admit: 2024-02-15 | Discharge: 2024-02-15 | Disposition: A | Discharge status: Home / Self Care | Source: Intra-hospital | Attending: Emergency Medicine | Admitting: Emergency Medicine

## 2024-02-15 ENCOUNTER — Other Ambulatory Visit: Payer: Self-pay

## 2024-02-15 DIAGNOSIS — F1210 Cannabis abuse, uncomplicated: Secondary | ICD-10-CM | POA: Diagnosis not present

## 2024-02-15 DIAGNOSIS — F10129 Alcohol abuse with intoxication, unspecified: Secondary | ICD-10-CM | POA: Diagnosis present

## 2024-02-15 DIAGNOSIS — F10920 Alcohol use, unspecified with intoxication, uncomplicated: Secondary | ICD-10-CM

## 2024-02-15 DIAGNOSIS — F1290 Cannabis use, unspecified, uncomplicated: Secondary | ICD-10-CM

## 2024-02-15 LAB — I-STAT CHEM 8, ED
BUN: 8 mg/dL (ref 6–20)
Calcium, Ion: 1.1 mmol/L — ABNORMAL LOW (ref 1.15–1.40)
Chloride: 111 mmol/L (ref 98–111)
Creatinine, Ser: 0.8 mg/dL (ref 0.44–1.00)
Glucose, Bld: 81 mg/dL (ref 70–99)
HCT: 45 % (ref 36.0–46.0)
Hemoglobin: 15.3 g/dL — ABNORMAL HIGH (ref 12.0–15.0)
Potassium: 3.6 mmol/L (ref 3.5–5.1)
Sodium: 144 mmol/L (ref 135–145)
TCO2: 17 mmol/L — ABNORMAL LOW (ref 22–32)

## 2024-02-15 LAB — CBC WITH DIFFERENTIAL/PLATELET
Abs Immature Granulocytes: 0.02 K/uL (ref 0.00–0.07)
Basophils Absolute: 0.1 K/uL (ref 0.0–0.1)
Basophils Relative: 1 %
Eosinophils Absolute: 0.1 K/uL (ref 0.0–0.5)
Eosinophils Relative: 2 %
HCT: 44.4 % (ref 36.0–46.0)
Hemoglobin: 14.7 g/dL (ref 12.0–15.0)
Immature Granulocytes: 0 %
Lymphocytes Relative: 33 %
Lymphs Abs: 2.6 K/uL (ref 0.7–4.0)
MCH: 27.2 pg (ref 26.0–34.0)
MCHC: 33.1 g/dL (ref 30.0–36.0)
MCV: 82.1 fL (ref 80.0–100.0)
Monocytes Absolute: 0.4 K/uL (ref 0.1–1.0)
Monocytes Relative: 5 %
Neutro Abs: 4.7 K/uL (ref 1.7–7.7)
Neutrophils Relative %: 59 %
Platelets: 229 K/uL (ref 150–400)
RBC: 5.41 MIL/uL — ABNORMAL HIGH (ref 3.87–5.11)
RDW: 14.6 % (ref 11.5–15.5)
WBC: 7.9 K/uL (ref 4.0–10.5)
nRBC: 0 % (ref 0.0–0.2)

## 2024-02-15 LAB — RAPID URINE DRUG SCREEN, HOSP PERFORMED
Amphetamines: NOT DETECTED
Barbiturates: NOT DETECTED
Benzodiazepines: NOT DETECTED
Cocaine: NOT DETECTED
Opiates: NOT DETECTED
Tetrahydrocannabinol: POSITIVE — AB

## 2024-02-15 MED ORDER — NALOXONE HCL 2 MG/2ML IJ SOSY
2.0000 mg | PREFILLED_SYRINGE | Freq: Once | INTRAMUSCULAR | Status: AC
  Administered 2024-02-15: 2 mg via INTRAVENOUS

## 2024-02-15 MED ORDER — ONDANSETRON HCL 4 MG/2ML IJ SOLN
4.0000 mg | Freq: Once | INTRAMUSCULAR | Status: AC
  Administered 2024-02-15: 4 mg via INTRAVENOUS
  Filled 2024-02-15: qty 2

## 2024-02-15 MED ORDER — SODIUM CHLORIDE 0.9 % IV BOLUS
500.0000 mL | Freq: Once | INTRAVENOUS | Status: AC
  Administered 2024-02-15: 500 mL via INTRAVENOUS

## 2024-02-15 MED ORDER — NALOXONE HCL 2 MG/2ML IJ SOSY
PREFILLED_SYRINGE | INTRAMUSCULAR | Status: AC
  Filled 2024-02-15: qty 2

## 2024-02-15 NOTE — ED Triage Notes (Signed)
 PT brought in by [facility] PT was stated to have been drinking and smoking marijuana all day. PT states she was at her BF. PT woke up and spoke to Rns and provider in the room. PT VSS

## 2024-02-15 NOTE — ED Provider Notes (Addendum)
 [place] EMERGENCY DEPARTMENT AT [facility] Provider Note   CSN: [id number] Arrival date & time: 02/15/24  9761     Patient presents with: Alcohol Intoxication   Tabitha [name] is a 18 y.o. female.   The history is provided by the patient.  Alcohol Intoxication This is a new problem. The current episode started 6 to 12 hours ago. The problem occurs constantly. The problem has not changed since onset.Pertinent negatives include no chest pain, no abdominal pain, no headaches and no shortness of breath. Nothing aggravates the symptoms. Nothing relieves the symptoms. She has tried nothing for the symptoms. The treatment provided no relief.  And used THC pen.       Prior to Admission medications   Medication Sig Start Date End Date Taking? Authorizing Provider  acetaminophen  (TYLENOL ) 500 MG tablet Take 2 tablets (1,000 mg total) by mouth every 6 (six) hours as needed for mild pain. 02/21/22   Meuth, Brooke A, PA-C  albuterol  (VENTOLIN  HFA) 108 (90 Base) MCG/ACT inhaler Use 2 puffs 15 minutes before exercise.  Always use spacer. Patient taking differently: Inhale 2 puffs into the lungs every 6 (six) hours as needed for shortness of breath. 07/17/20   Ben-Davies, Maureen E, MD  ipratropium (ATROVENT ) 0.06 % nasal spray Place 2 sprays into both nostrils 4 (four) times daily. 07/17/23   Bernardino Ditch, NP  medroxyPROGESTERone  (PROVERA ) 10 MG tablet Take 1 tablet (10 mg total) by mouth daily. Use for ten days Patient not taking: Reported on 05/27/2023 02/17/23   Cleatus Moccasin, MD  nitrofurantoin , macrocrystal-monohydrate, (MACROBID ) 100 MG capsule Take 1 capsule (100 mg total) by mouth 2 (two) times daily. 07/17/23   Bernardino Ditch, NP    Allergies: Bacitracin    Review of Systems  Constitutional:  Negative for fever.  Respiratory:  Negative for shortness of breath.   Cardiovascular:  Negative for chest pain.  Gastrointestinal:  Negative for abdominal pain.   Neurological:  Negative for headaches.  All other systems reviewed and are negative.   Updated Vital Signs BP 108/76   Pulse 89   Temp 97.9 F (36.6 C)   Resp 18   Ht 5' 3 (1.6 m)   Wt 56.7 kg   SpO2 100%   BMI 22.14 kg/m   Physical Exam Vitals and nursing note reviewed.  Constitutional:      General: She is not in acute distress.    Appearance: Normal appearance. She is well-developed.  HENT:     Head: Normocephalic and atraumatic.     Nose: Nose normal.  Eyes:     Pupils: Pupils are equal, round, and reactive to light.  Cardiovascular:     Rate and Rhythm: Normal rate and regular rhythm.     Pulses: Normal pulses.     Heart sounds: Normal heart sounds.  Pulmonary:     Effort: Pulmonary effort is normal. No respiratory distress.     Breath sounds: Normal breath sounds.  Abdominal:     General: Bowel sounds are normal. There is no distension.     Palpations: Abdomen is soft.     Tenderness: There is no abdominal tenderness. There is no guarding or rebound.  Musculoskeletal:        General: Normal range of motion.     Cervical back: Neck supple.  Skin:    General: Skin is warm and dry.     Capillary Refill: Capillary refill takes less than 2 seconds.     Findings: No erythema  or rash.  Neurological:     General: No focal deficit present.     Mental Status: She is alert.     Deep Tendon Reflexes: Reflexes normal.  Psychiatric:        Mood and Affect: Mood normal.        Behavior: Behavior normal.     (all labs ordered are listed, but only abnormal results are displayed) Results for orders placed or performed during the hospital encounter of 02/15/24  CBC with Differential   Collection Time: 02/15/24  2:59 AM  Result Value Ref Range   WBC 7.9 4.0 - 10.5 K/uL   RBC 5.41 (H) 3.87 - 5.11 MIL/uL   Hemoglobin 14.7 12.0 - 15.0 g/dL   HCT 55.5 63.9 - 53.9 %   MCV 82.1 80.0 - 100.0 fL   MCH 27.2 26.0 - 34.0 pg   MCHC 33.1 30.0 - 36.0 g/dL   RDW 85.3 88.4 -  84.4 %   Platelets 229 150 - 400 K/uL   nRBC 0.0 0.0 - 0.2 %   Neutrophils Relative % 59 %   Neutro Abs 4.7 1.7 - 7.7 K/uL   Lymphocytes Relative 33 %   Lymphs Abs 2.6 0.7 - 4.0 K/uL   Monocytes Relative 5 %   Monocytes Absolute 0.4 0.1 - 1.0 K/uL   Eosinophils Relative 2 %   Eosinophils Absolute 0.1 0.0 - 0.5 K/uL   Basophils Relative 1 %   Basophils Absolute 0.1 0.0 - 0.1 K/uL   Immature Granulocytes 0 %   Abs Immature Granulocytes 0.02 0.00 - 0.07 K/uL  I-stat chem 8, ED (not at [facility], [facility] or [facility])   Collection Time: 02/15/24  3:04 AM  Result Value Ref Range   Sodium 144 135 - 145 mmol/L   Potassium 3.6 3.5 - 5.1 mmol/L   Chloride 111 98 - 111 mmol/L   BUN 8 6 - 20 mg/dL   Creatinine, Ser 9.19 0.44 - 1.00 mg/dL   Glucose, Bld 81 70 - 99 mg/dL   Calcium, Ion 8.89 (L) 1.15 - 1.40 mmol/L   TCO2 17 (L) 22 - 32 mmol/L   Hemoglobin 15.3 (H) 12.0 - 15.0 g/dL   HCT 54.9 63.9 - 53.9 %  Rapid urine drug screen (hospital performed)   Collection Time: 02/15/24  3:46 AM  Result Value Ref Range   Opiates NONE DETECTED NONE DETECTED   Cocaine NONE DETECTED NONE DETECTED   Benzodiazepines NONE DETECTED NONE DETECTED   Amphetamines NONE DETECTED NONE DETECTED   Tetrahydrocannabinol POSITIVE (A) NONE DETECTED   Barbiturates NONE DETECTED NONE DETECTED   No results found.  EKG: EKG Interpretation Date/Time:  Sunday February 15 2024 02:45:07 EDT Ventricular Rate:  79 PR Interval:  176 QRS Duration:  93 QT Interval:  381 QTC Calculation: 437 R Axis:   96  Text Interpretation: Sinus rhythm Confirmed by Nettie, Louis Gaw ([id number]) on 02/15/2024 4:54:28 AM  Radiology: No results found.   Procedures   Medications Ordered in the ED  ondansetron  (ZOFRAN ) injection 4 mg (4 mg Intravenous Given 02/15/24 0308)  sodium chloride  0.9 % bolus 500 mL (500 mLs Intravenous New Bag/Given 02/15/24 0308)                                    Medical Decision Making Patient with ETOH and THC  Amount  and/or Complexity of Data Reviewed Independent Historian: EMS    Details:  See above  External Data Reviewed: notes.    Details: Previous notes reviewed  Labs: ordered.    Details: UDS positive for THC.  Normal white count 7.98, normal hemoglobin 14.9, normal platelets.  Normal sodium and potassium   Risk Prescription drug management. Risk Details: Observed in the ED.  No EMESIS.  GCS is 15.  Walked out with family AO4.  Stable for discharge.  Strict returns.       Final diagnoses:  Acute alcoholic intoxication without complication (HCC)  Marijuana use   No signs of systemic illness or infection. The patient is nontoxic-appearing on exam and vital signs are within normal limits.  I have reviewed the triage vital signs and the nursing notes. Pertinent labs & imaging results that were available during my care of the patient were reviewed by me and considered in my medical decision making (see chart for details). After history, exam, and medical workup I feel the patient has been appropriately medically screened and is safe for discharge home. Pertinent diagnoses were discussed with the patient. Patient was given return precautions    Robbye Dede, MD 02/15/24 [phone]
